# Patient Record
Sex: Male | Born: 1955
Health system: Southern US, Community
[De-identification: ages and names within clinical notes are randomized; demographics above are authoritative.]

## PROBLEM LIST (undated history)

## (undated) ENCOUNTER — Ambulatory Visit: Admission: EM | Discharge status: Absent without leave | Payer: 59

## (undated) DIAGNOSIS — C01 Malignant neoplasm of base of tongue: Secondary | ICD-10-CM

## (undated) DIAGNOSIS — M1990 Unspecified osteoarthritis, unspecified site: Secondary | ICD-10-CM

## (undated) DIAGNOSIS — Z9221 Personal history of antineoplastic chemotherapy: Secondary | ICD-10-CM

## (undated) DIAGNOSIS — Z923 Personal history of irradiation: Secondary | ICD-10-CM

## (undated) HISTORY — DX: Unspecified osteoarthritis, unspecified site: M19.90

## (undated) HISTORY — DX: Personal history of antineoplastic chemotherapy: Z92.21

## (undated) HISTORY — DX: Malignant neoplasm of base of tongue: C01

## (undated) HISTORY — DX: Personal history of irradiation: Z92.3

---

## 1998-06-13 ENCOUNTER — Other Ambulatory Visit: Admission: RE | Admit: 1998-06-13 | Discharge: 1998-06-13 | Payer: Self-pay | Admitting: Family Medicine

## 2003-11-24 DIAGNOSIS — Z9221 Personal history of antineoplastic chemotherapy: Secondary | ICD-10-CM

## 2003-11-24 HISTORY — DX: Personal history of antineoplastic chemotherapy: Z92.21

## 2004-01-07 ENCOUNTER — Encounter (INDEPENDENT_AMBULATORY_CARE_PROVIDER_SITE_OTHER): Payer: Self-pay | Admitting: Specialist

## 2004-01-07 ENCOUNTER — Ambulatory Visit (HOSPITAL_BASED_OUTPATIENT_CLINIC_OR_DEPARTMENT_OTHER): Admission: RE | Admit: 2004-01-07 | Discharge: 2004-01-07 | Payer: Self-pay | Admitting: Otolaryngology

## 2004-01-07 ENCOUNTER — Ambulatory Visit (HOSPITAL_COMMUNITY): Admission: RE | Admit: 2004-01-07 | Discharge: 2004-01-07 | Payer: Self-pay | Admitting: Otolaryngology

## 2004-01-07 DIAGNOSIS — C01 Malignant neoplasm of base of tongue: Secondary | ICD-10-CM

## 2004-01-07 HISTORY — PX: DIRECT LARYNGOSCOPY: SHX5326

## 2004-01-07 HISTORY — DX: Malignant neoplasm of base of tongue: C01

## 2004-01-09 ENCOUNTER — Ambulatory Visit: Admission: RE | Admit: 2004-01-09 | Discharge: 2004-04-08 | Payer: Self-pay | Admitting: Radiation Oncology

## 2004-01-11 ENCOUNTER — Encounter: Admission: RE | Admit: 2004-01-11 | Discharge: 2004-01-11 | Payer: Self-pay | Admitting: Dentistry

## 2004-01-15 HISTORY — PX: MULTIPLE EXTRACTIONS WITH ALVEOLOPLASTY: SHX5342

## 2004-01-17 ENCOUNTER — Ambulatory Visit (HOSPITAL_COMMUNITY): Admission: RE | Admit: 2004-01-17 | Discharge: 2004-01-17 | Payer: Self-pay | Admitting: Radiation Oncology

## 2004-01-21 ENCOUNTER — Ambulatory Visit (HOSPITAL_COMMUNITY): Admission: RE | Admit: 2004-01-21 | Discharge: 2004-01-21 | Payer: Self-pay | Admitting: Radiation Oncology

## 2004-01-23 ENCOUNTER — Ambulatory Visit (HOSPITAL_COMMUNITY): Admission: RE | Admit: 2004-01-23 | Discharge: 2004-01-23 | Payer: Self-pay | Admitting: Oncology

## 2004-02-04 ENCOUNTER — Inpatient Hospital Stay (HOSPITAL_COMMUNITY): Admission: AD | Admit: 2004-02-04 | Discharge: 2004-02-10 | Payer: Self-pay | Admitting: Oncology

## 2004-04-10 ENCOUNTER — Ambulatory Visit: Admission: RE | Admit: 2004-04-10 | Discharge: 2004-04-10 | Payer: Self-pay | Admitting: Radiation Oncology

## 2004-04-24 ENCOUNTER — Ambulatory Visit: Admission: RE | Admit: 2004-04-24 | Discharge: 2004-04-24 | Payer: Self-pay | Admitting: Radiation Oncology

## 2004-05-05 ENCOUNTER — Ambulatory Visit (HOSPITAL_COMMUNITY): Admission: RE | Admit: 2004-05-05 | Discharge: 2004-05-05 | Payer: Self-pay | Admitting: Oncology

## 2004-06-12 ENCOUNTER — Ambulatory Visit: Admission: RE | Admit: 2004-06-12 | Discharge: 2004-06-12 | Payer: Self-pay | Admitting: Radiation Oncology

## 2004-07-17 ENCOUNTER — Ambulatory Visit: Admission: RE | Admit: 2004-07-17 | Discharge: 2004-07-17 | Payer: Self-pay | Admitting: Radiation Oncology

## 2004-07-23 ENCOUNTER — Ambulatory Visit: Payer: Self-pay | Admitting: Dentistry

## 2004-07-31 ENCOUNTER — Ambulatory Visit: Payer: Self-pay | Admitting: Dentistry

## 2004-08-06 ENCOUNTER — Ambulatory Visit (HOSPITAL_COMMUNITY): Admission: RE | Admit: 2004-08-06 | Discharge: 2004-08-06 | Payer: Self-pay | Admitting: Radiation Oncology

## 2004-08-15 ENCOUNTER — Ambulatory Visit (HOSPITAL_COMMUNITY): Admission: RE | Admit: 2004-08-15 | Discharge: 2004-08-15 | Payer: Self-pay | Admitting: Oncology

## 2004-08-21 ENCOUNTER — Ambulatory Visit: Admission: RE | Admit: 2004-08-21 | Discharge: 2004-08-21 | Payer: Self-pay | Admitting: Radiation Oncology

## 2004-09-24 ENCOUNTER — Ambulatory Visit: Payer: Self-pay | Admitting: Dentistry

## 2004-10-23 ENCOUNTER — Ambulatory Visit: Admission: RE | Admit: 2004-10-23 | Discharge: 2004-10-23 | Payer: Self-pay | Admitting: Radiation Oncology

## 2004-10-27 ENCOUNTER — Ambulatory Visit: Admission: RE | Admit: 2004-10-27 | Discharge: 2004-10-27 | Payer: Self-pay | Admitting: Radiation Oncology

## 2004-10-30 ENCOUNTER — Ambulatory Visit: Payer: Self-pay | Admitting: Oncology

## 2004-12-01 ENCOUNTER — Ambulatory Visit: Payer: Self-pay | Admitting: Dentistry

## 2005-01-29 ENCOUNTER — Ambulatory Visit: Admission: RE | Admit: 2005-01-29 | Discharge: 2005-01-29 | Payer: Self-pay | Admitting: Radiation Oncology

## 2005-01-29 ENCOUNTER — Ambulatory Visit: Payer: Self-pay | Admitting: Oncology

## 2005-05-07 ENCOUNTER — Ambulatory Visit: Admission: RE | Admit: 2005-05-07 | Discharge: 2005-05-07 | Payer: Self-pay | Admitting: Radiation Oncology

## 2005-05-19 ENCOUNTER — Ambulatory Visit: Payer: Self-pay | Admitting: Oncology

## 2005-08-13 ENCOUNTER — Ambulatory Visit: Payer: Self-pay | Admitting: Oncology

## 2005-09-30 ENCOUNTER — Ambulatory Visit (HOSPITAL_COMMUNITY): Admission: RE | Admit: 2005-09-30 | Discharge: 2005-09-30 | Payer: Self-pay | Admitting: Oncology

## 2005-10-21 ENCOUNTER — Encounter (INDEPENDENT_AMBULATORY_CARE_PROVIDER_SITE_OTHER): Payer: Self-pay | Admitting: Specialist

## 2005-10-21 ENCOUNTER — Inpatient Hospital Stay (HOSPITAL_COMMUNITY): Admission: RE | Admit: 2005-10-21 | Discharge: 2005-10-23 | Payer: Self-pay | Admitting: Otolaryngology

## 2005-10-21 HISTORY — PX: OTHER SURGICAL HISTORY: SHX169

## 2005-12-10 ENCOUNTER — Ambulatory Visit: Payer: Self-pay | Admitting: Oncology

## 2006-01-28 ENCOUNTER — Ambulatory Visit: Payer: Self-pay | Admitting: Oncology

## 2006-05-31 ENCOUNTER — Ambulatory Visit: Payer: Self-pay | Admitting: Oncology

## 2006-06-01 LAB — CBC WITH DIFFERENTIAL/PLATELET
BASO%: 0.6 % (ref 0.0–2.0)
Basophils Absolute: 0 10*3/uL (ref 0.0–0.1)
EOS%: 2 % (ref 0.0–7.0)
Eosinophils Absolute: 0.1 10*3/uL (ref 0.0–0.5)
HCT: 40.8 % (ref 38.7–49.9)
HGB: 13.9 g/dL (ref 13.0–17.1)
LYMPH%: 25.7 % (ref 14.0–48.0)
MCH: 30.5 pg (ref 28.0–33.4)
MCHC: 34 g/dL (ref 32.0–35.9)
MCV: 89.8 fL (ref 81.6–98.0)
MONO#: 0.4 10*3/uL (ref 0.1–0.9)
MONO%: 14.1 % — ABNORMAL HIGH (ref 0.0–13.0)
NEUT#: 1.7 10*3/uL (ref 1.5–6.5)
NEUT%: 57.6 % (ref 40.0–75.0)
Platelets: 245 10*3/uL (ref 145–400)
RBC: 4.54 10*6/uL (ref 4.20–5.71)
RDW: 12.2 % (ref 11.2–14.6)
WBC: 2.9 10*3/uL — ABNORMAL LOW (ref 4.0–10.0)
lymph#: 0.8 10*3/uL — ABNORMAL LOW (ref 0.9–3.3)

## 2006-06-01 LAB — COMPREHENSIVE METABOLIC PANEL
ALT: 25 U/L (ref 0–40)
AST: 28 U/L (ref 0–37)
Albumin: 4.2 g/dL (ref 3.5–5.2)
Alkaline Phosphatase: 85 U/L (ref 39–117)
BUN: 21 mg/dL (ref 6–23)
CO2: 28 mEq/L (ref 19–32)
Calcium: 9.1 mg/dL (ref 8.4–10.5)
Chloride: 103 mEq/L (ref 96–112)
Creatinine, Ser: 1 mg/dL (ref 0.40–1.50)
Glucose, Bld: 92 mg/dL (ref 70–99)
Potassium: 4.4 mEq/L (ref 3.5–5.3)
Sodium: 142 mEq/L (ref 135–145)
Total Bilirubin: 0.5 mg/dL (ref 0.3–1.2)
Total Protein: 6.9 g/dL (ref 6.0–8.3)

## 2006-06-01 LAB — LACTATE DEHYDROGENASE: LDH: 130 U/L (ref 94–250)

## 2006-09-29 ENCOUNTER — Ambulatory Visit: Payer: Self-pay | Admitting: Oncology

## 2006-10-01 LAB — BUN: BUN: 18 mg/dL (ref 6–23)

## 2006-10-01 LAB — CREATININE, SERUM: Creatinine, Ser: 1.09 mg/dL (ref 0.40–1.50)

## 2007-03-21 ENCOUNTER — Ambulatory Visit: Payer: Self-pay | Admitting: Dentistry

## 2007-03-28 ENCOUNTER — Ambulatory Visit: Payer: Self-pay | Admitting: Oncology

## 2007-03-30 LAB — CBC WITH DIFFERENTIAL/PLATELET
BASO%: 0.6 % (ref 0.0–2.0)
Basophils Absolute: 0 10*3/uL (ref 0.0–0.1)
EOS%: 3.3 % (ref 0.0–7.0)
Eosinophils Absolute: 0.1 10*3/uL (ref 0.0–0.5)
HCT: 41.1 % (ref 38.7–49.9)
HGB: 14.4 g/dL (ref 13.0–17.1)
LYMPH%: 25.7 % (ref 14.0–48.0)
MCH: 30.9 pg (ref 28.0–33.4)
MCHC: 35 g/dL (ref 32.0–35.9)
MCV: 88.2 fL (ref 81.6–98.0)
MONO#: 0.3 10*3/uL (ref 0.1–0.9)
MONO%: 10.9 % (ref 0.0–13.0)
NEUT#: 1.9 10*3/uL (ref 1.5–6.5)
NEUT%: 59.5 % (ref 40.0–75.0)
Platelets: 256 10*3/uL (ref 145–400)
RBC: 4.66 10*6/uL (ref 4.20–5.71)
RDW: 12.2 % (ref 11.2–14.6)
WBC: 3.2 10*3/uL — ABNORMAL LOW (ref 4.0–10.0)
lymph#: 0.8 10*3/uL — ABNORMAL LOW (ref 0.9–3.3)

## 2007-03-30 LAB — COMPREHENSIVE METABOLIC PANEL
Albumin: 4.3 g/dL (ref 3.5–5.2)
BUN: 17 mg/dL (ref 6–23)
CO2: 27 mEq/L (ref 19–32)
Calcium: 9.3 mg/dL (ref 8.4–10.5)
Chloride: 103 mEq/L (ref 96–112)
Creatinine, Ser: 1.12 mg/dL (ref 0.40–1.50)
Potassium: 4.1 mEq/L (ref 3.5–5.3)

## 2007-03-30 LAB — MORPHOLOGY
PLT EST: ADEQUATE
RBC Comments: NORMAL

## 2007-03-30 LAB — LACTATE DEHYDROGENASE: LDH: 115 U/L (ref 94–250)

## 2007-09-25 IMAGING — CT NM PET TUM IMG SKULL BASE T - THIGH
8 series · 25 of 25 positions shown · IV contrast ([ID])
Comparison: 08/15/04.

CLINICAL DATA: Head and neck carcinoma.  Evaluate for disease recurrence.  
FDG PET-CT TUMOR IMAGING (SKULL BASE TO THIGHS):
TECHNIQUE: 17 mCi F-18 FDG were administered via left antecubital fossa.  Full ring PET imaging was performed from the skull base through the mid-thighs 85 minutes after injection.  CT data was obtained and used for attenuation correction and anatomic localization only.  (This was not acquired as a diagnostic CT examination.)

[Series 1: pet ac · axial · 3.3mm · 4.69mm/px · z∈[-294,+0]mm · 2 of 91 slices shown (1 of 2)]
[im 1/91]
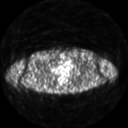
[im 91/91]
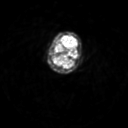

[Series 1: pet ac · axial · 3.3mm · 4.69mm/px · z∈[-870,+0]mm · 5 of 267 slices shown (2 of 2)]
[im 1/267]
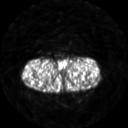
[im 67/267]
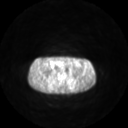
[im 134/267]
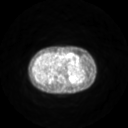
[im 200/267]
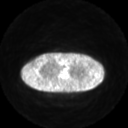
[im 267/267]
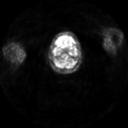

[Series 2: pet nac · axial · 3.3mm · 4.69mm/px · z∈[-294,+0]mm · 2 of 91 slices shown (1 of 2)]
[im 1/91]
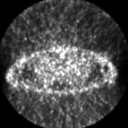
[im 91/91]
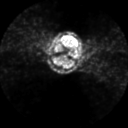

[Series 2: ct images · axial · 3.8mm · 0.98mm/px · z∈[-870,+0]mm · 6 of 267 slices shown (1 of 2)]
[im 1/267]
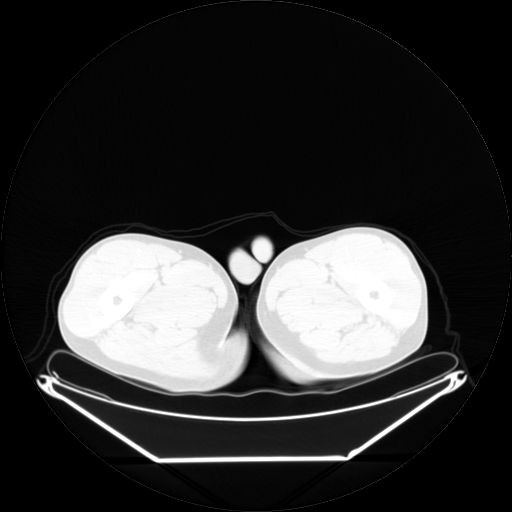
[im 54/267]
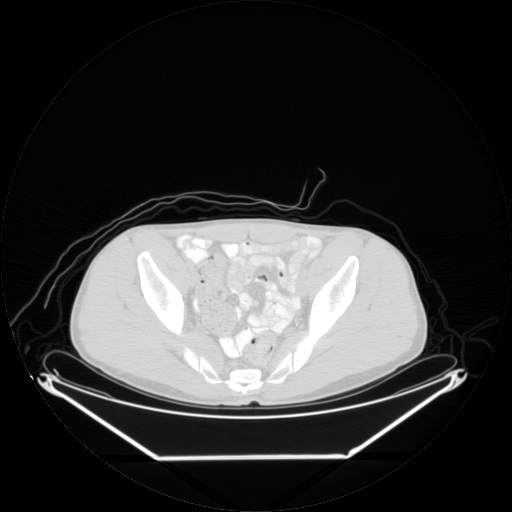
[im 107/267]
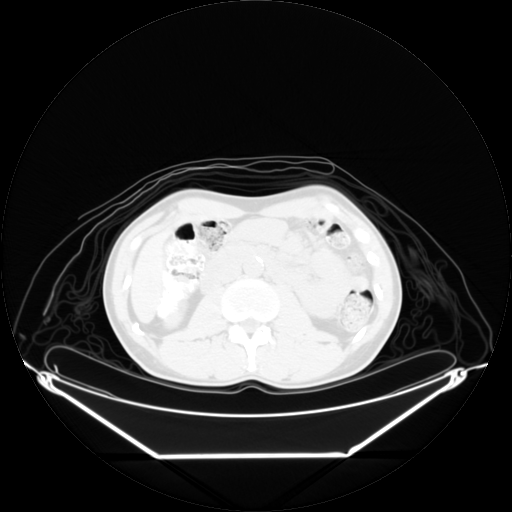
[im 160/267]
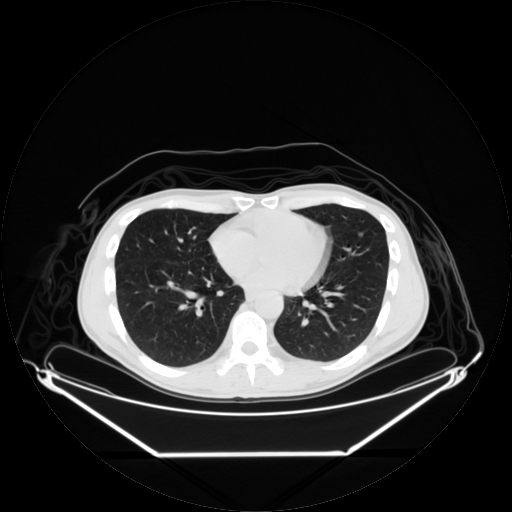
[im 213/267]
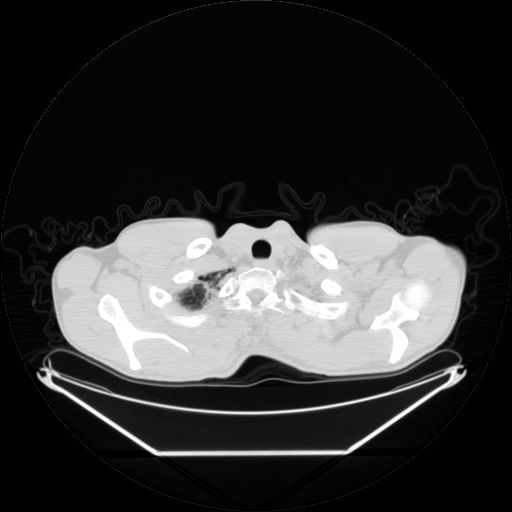
[im 267/267  brain]
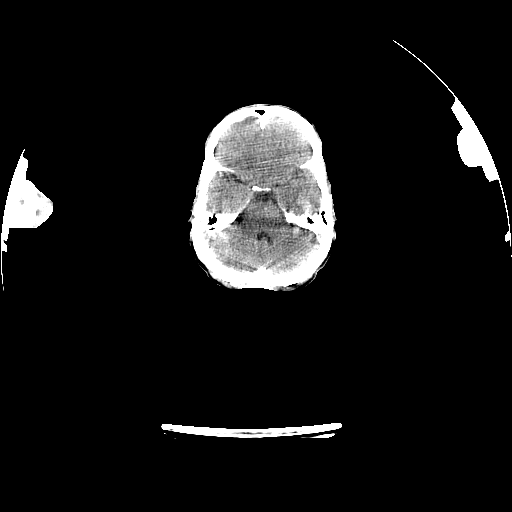

[Series 2: pet nac · axial · 3.3mm · 4.69mm/px · z∈[-870,+0]mm · 6 of 267 slices shown (2 of 2)]
[im 1/267]
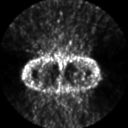
[im 54/267]
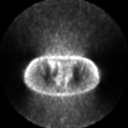
[im 107/267]
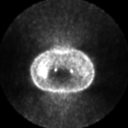
[im 160/267]
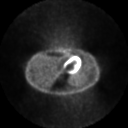
[im 213/267]
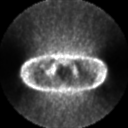
[im 267/267]
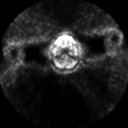

[Series 5: ct images · axial · 3.8mm · 0.98mm/px · z∈[-294,+0]mm · 2 of 91 slices shown (2 of 2)]
[im 1/91]
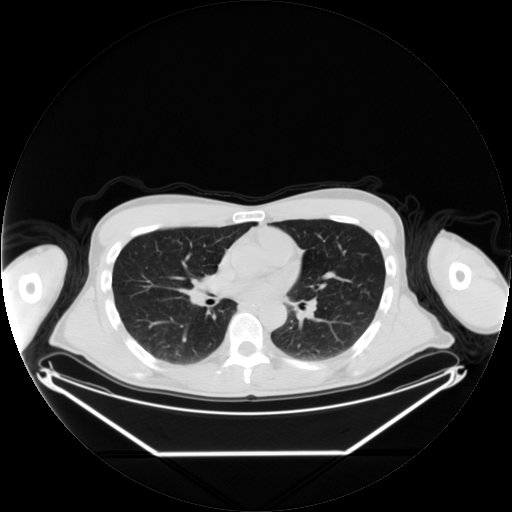
[im 91/91]
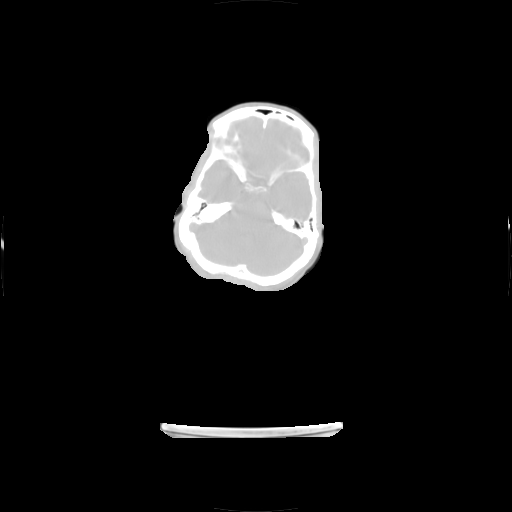

[Series 123: mip · coronal · 3.3mm · 4.69mm/px · 1 of 30 slices shown (1 of 2)]
[im 1/30]
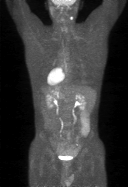

[Series 123: mip · coronal · 3.3mm · 4.69mm/px · 1 of 30 slices shown (2 of 2)]
[im 1/30]
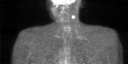

[25 of 25 positions shown; findings below may reference images not displayed]

FINDINGS: Within the right internal jugular lymph node chain there is an intense focus of increased FDG uptake corresponding to an enlarged lymph node.  The SUV max for this lymph node equals 17.0.  
No additional abnormal foci of increased radiotracer uptake is identified within the soft tissues of the neck. 
No hypermetabolic axillary lymphadenopathy.  No hypermetabolic hilar or mediastinal lymphadenopathy.  
No hypermetabolic pulmonary nodules or masses are identified.  
There is a right iliac lymph node which measures 10.4 x 6.4 mm and has moderate increased FDG uptake with an SUV max equal to 5.5.  
No additional hypermetabolic lymph nodes are identified.
IMPRESSION: 1.  Hypermetabolic left internal jugular lymph node concerning for disease recurrence. 
2.  Non-pathologically enlarged right iliac lymph node demonstrates moderate increased FDG uptake.  This would be an atypical presentation for head and neck cancer metastasis and I suspect this likely represents reactive lymphadenopathy.  Nevertheless attention on followup examination is recommended.

## 2007-10-05 ENCOUNTER — Ambulatory Visit: Payer: Self-pay | Admitting: Oncology

## 2007-10-11 LAB — COMPREHENSIVE METABOLIC PANEL
ALT: 28 U/L (ref 0–53)
AST: 21 U/L (ref 0–37)
Albumin: 4.2 g/dL (ref 3.5–5.2)
Alkaline Phosphatase: 81 U/L (ref 39–117)
BUN: 16 mg/dL (ref 6–23)
Calcium: 9.3 mg/dL (ref 8.4–10.5)
Chloride: 103 mEq/L (ref 96–112)
Potassium: 3.7 mEq/L (ref 3.5–5.3)
Sodium: 142 mEq/L (ref 135–145)
Total Protein: 7.1 g/dL (ref 6.0–8.3)

## 2007-10-11 LAB — CBC WITH DIFFERENTIAL/PLATELET
Basophils Absolute: 0.1 10*3/uL (ref 0.0–0.1)
EOS%: 3.7 % (ref 0.0–7.0)
Eosinophils Absolute: 0.1 10*3/uL (ref 0.0–0.5)
HGB: 15.3 g/dL (ref 13.0–17.1)
MCH: 30.5 pg (ref 28.0–33.4)
MCV: 87.9 fL (ref 81.6–98.0)
MONO%: 9.1 % (ref 0.0–13.0)
NEUT#: 1.6 10*3/uL (ref 1.5–6.5)
RBC: 5.03 10*6/uL (ref 4.20–5.71)
RDW: 12.5 % (ref 11.2–14.6)
lymph#: 0.9 10*3/uL (ref 0.9–3.3)

## 2007-10-14 IMAGING — CR DG CHEST 2V
2 series · 2 of 2 positions shown · non-contrast
Comparison: CT chest 05/05/2004.

CLINICAL DATA: Cancer involving the base of the tongue. Preoperative respiratory
evaluation.

CHEST - 2 VIEW  10/19/2005:

[view not recorded (1 of 2)]
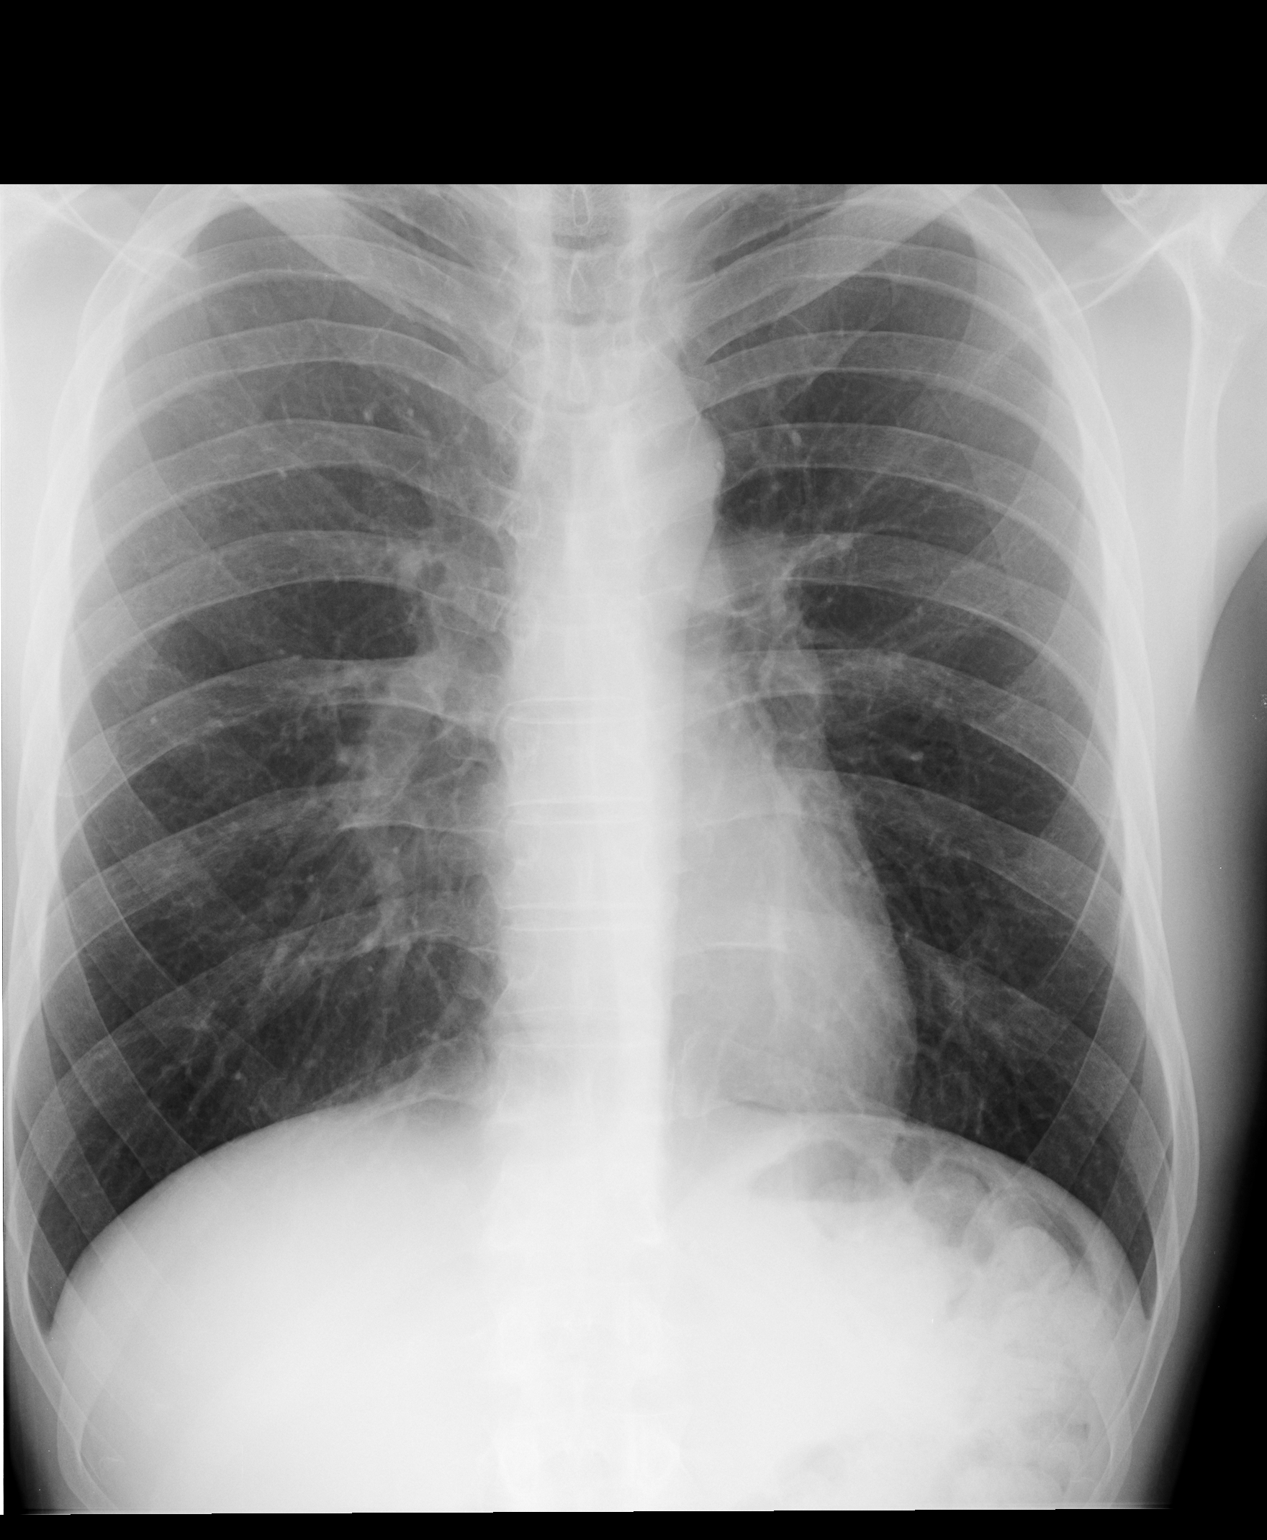

[view not recorded (2 of 2)]
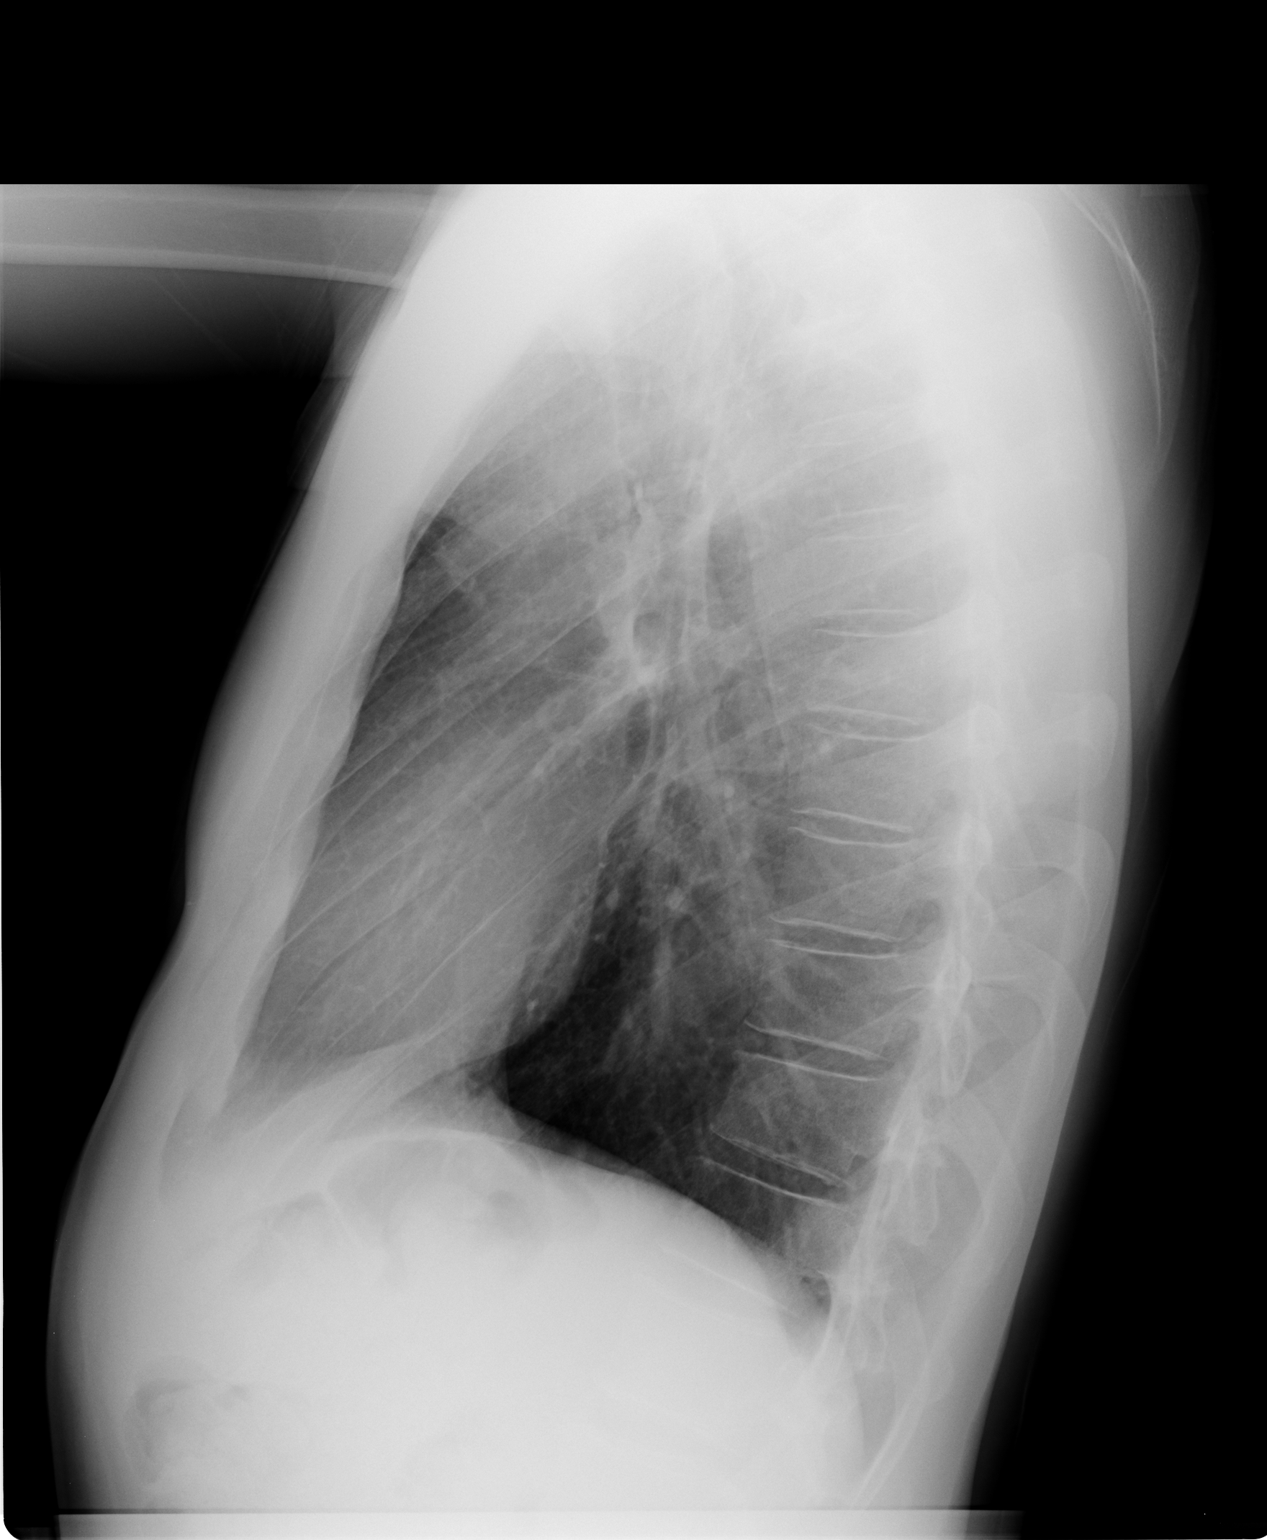

[2 of 2 positions shown; findings below may reference images not displayed]

FINDINGS: The cardiomediastinal silhouette is unremarkable. The lungs are
mildly hyperinflated but clear. There are no pleural effusions. The visualized
bony thorax appears intact.
IMPRESSION: Hyperinflation consistent with COPD or asthma. No acute cardiopulmonary disease.
No evidence of metastasis.

## 2007-11-18 ENCOUNTER — Ambulatory Visit: Payer: Self-pay | Admitting: Oncology

## 2008-04-05 ENCOUNTER — Ambulatory Visit: Payer: Self-pay | Admitting: Oncology

## 2008-09-20 ENCOUNTER — Ambulatory Visit: Payer: Self-pay | Admitting: Oncology

## 2008-09-24 LAB — COMPREHENSIVE METABOLIC PANEL
ALT: 20 U/L (ref 0–53)
AST: 20 U/L (ref 0–37)
Albumin: 4.2 g/dL (ref 3.5–5.2)
Alkaline Phosphatase: 95 U/L (ref 39–117)
BUN: 21 mg/dL (ref 6–23)
CO2: 27 mEq/L (ref 19–32)
Calcium: 9 mg/dL (ref 8.4–10.5)
Chloride: 104 mEq/L (ref 96–112)
Creatinine, Ser: 1.07 mg/dL (ref 0.40–1.50)
Glucose, Bld: 98 mg/dL (ref 70–99)
Potassium: 4.2 mEq/L (ref 3.5–5.3)
Sodium: 138 mEq/L (ref 135–145)
Total Bilirubin: 0.5 mg/dL (ref 0.3–1.2)
Total Protein: 6.9 g/dL (ref 6.0–8.3)

## 2008-09-24 LAB — CBC WITH DIFFERENTIAL/PLATELET
BASO%: 0.7 % (ref 0.0–2.0)
Basophils Absolute: 0 10*3/uL (ref 0.0–0.1)
EOS%: 5 % (ref 0.0–7.0)
Eosinophils Absolute: 0.2 10*3/uL (ref 0.0–0.5)
HCT: 44.4 % (ref 38.7–49.9)
HGB: 15.4 g/dL (ref 13.0–17.1)
LYMPH%: 28.5 % (ref 14.0–48.0)
MCH: 31 pg (ref 28.0–33.4)
MCHC: 34.7 g/dL (ref 32.0–35.9)
MCV: 89.4 fL (ref 81.6–98.0)
MONO#: 0.5 10*3/uL (ref 0.1–0.9)
MONO%: 14.9 % — ABNORMAL HIGH (ref 0.0–13.0)
NEUT#: 1.8 10*3/uL (ref 1.5–6.5)
NEUT%: 50.9 % (ref 40.0–75.0)
Platelets: 249 10*3/uL (ref 145–400)
RBC: 4.96 10*6/uL (ref 4.20–5.71)
RDW: 12.6 % (ref 11.2–14.6)
WBC: 3.6 10*3/uL — ABNORMAL LOW (ref 4.0–10.0)
lymph#: 1 10*3/uL (ref 0.9–3.3)

## 2008-09-24 LAB — MORPHOLOGY: PLT EST: ADEQUATE

## 2008-09-24 LAB — LACTATE DEHYDROGENASE: LDH: 104 U/L (ref 94–250)

## 2009-09-18 ENCOUNTER — Ambulatory Visit: Payer: Self-pay | Admitting: Oncology

## 2010-12-13 ENCOUNTER — Encounter: Payer: Self-pay | Admitting: Radiation Oncology

## 2011-04-10 NOTE — Discharge Summary (Signed)
[name], [name]             ACCOUNT NO.:  [id number]   MEDICAL RECORD NO.:  [id number]          PATIENT TYPE:  INP   LOCATION:  5707                         FACILITY:  [facility]   PHYSICIAN:  Kristine Garbe. Ezzard Standing, M.D.DATE OF BIRTH:  10/26/1956   DATE OF ADMISSION:  10/21/2005  DATE OF DISCHARGE:  10/23/2005                                 DISCHARGE SUMMARY   ADMITTING DIAGNOSIS:  Right neck metastatic squamous cell carcinoma.   OPERATIONS DURING THIS HOSPITALIZATION:  Direct laryngoscopy, right radical  neck dissection on October 21, 2005.   CONSULTS:  None.   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 He  underwent primary treatment with radiation therapy about a year ago. He had  a slowly enlarging right neck node consistent with persistent right  metastatic disease. He is admitted to the hospital via the operating room at  this time for a right radical neck dissection.   HOSPITAL COURSE:  The patient was admitted via the operating room on  October 21, 2005 at which time he underwent a direct laryngoscopy and right  radical neck dissection. This was uncomplicated postoperatively. The patient  received perioperative antibiotic of Ancef. He remained afebrile. Vital  signs were stable. He had no difficulty with p.o. diet. His neck drain was  subsequently removed on the second postoperative day and the patient was  discharged to home. Final pathology report revealed 1 of 10 right neck nodes  positive for squamous cell carcinoma.   DISPOSITION:  The patient is discharged to home on Tylenol and Vicodin  p.r.n. pain. A regular diet. Have him follow up in my office in 5 days for  recheck to have the sutures and staples removed from the right neck.           ______________________________  Kristine Garbe. Ezzard Standing, M.D.     CEN/MEDQ  D:  12/30/2005  T:  12/30/2005  Job:   [id number]

## 2011-04-10 NOTE — Op Note (Signed)
[name], Benitez             ACCOUNT NO.:  [id number]   MEDICAL RECORD NO.:  [id number]          PATIENT TYPE:  INP   LOCATION:  5707                         FACILITY:  [facility]   PHYSICIAN:  Kristine Garbe. Ezzard Standing, M.D.DATE OF BIRTH:  1956/02/19   DATE OF PROCEDURE:  10/21/2005  DATE OF DISCHARGE:                                 OPERATIVE REPORT   PREOPERATIVE DIAGNOSIS:  Metastatic squamous cell carcinoma to the right  neck.   POSTOPERATIVE DIAGNOSIS:  Metastatic squamous cell carcinoma to the right  neck.   OPERATION:  1.  Direct laparoscopy.  2.  Modified right radical neck dissection.   SURGEON:  Kristine Garbe. Ezzard Standing, M.D.   ASSISTANT:  Hermelinda Medicus, M.D.   ANESTHESIA:  General endotracheal.   COMPLICATIONS:  None.   ESTIMATED BLOOD LOSS:  50 mL.   BRIEF CLINICAL NOTE:  [name] is a 55 year old gentleman who is  status post chemoradiation therapy for primary treatment of a right base of  tongue cancer, T2, N1 lesion.  He had done well for 54 months, but more  recently he had noticed an enlarging right upper jugular lymph node.  PET  scan was performed and was consistent with carcinoma in the upper jugular  lymph node with no evidence of recurrence of a primary on PET scan.  On exam  in the office, upper airway exam was clear.  He has an approximate 2-cm  right upper jugular lymph node just inferior to the angle of the mandible.  He is taken to the operating room at this time for a right neck dissection.   DESCRIPTION OF PROCEDURE:  After adequate endotracheal anesthesia, the  patient received 1 g of Ancef IV preoperatively.  First, direct laparoscopy  was performed.  On direct laryngoscopy, the base of tongue and vallecula  were clear, epiglottis was normal, piriform sinuses were clear and both  vocal cords were normal in appearance.  Palpation of the base of the tongue  was soft.  This completed the direct laryngoscopy; no biopsies were taken.   Next, the patient was turned and the neck was prepped with Betadine solution  and draped out with sterile towels.  The proposed incisions sites were made  and injected with Xylocaine with epinephrine.  Incision was then carried as  a horizontal incision below the angle of the mandible, through the platysma  muscle and a small vertical limb was dropped posteriorly, again through the  platysma muscle.  Subplatysmal flaps were elevated superiorly, posterior and  inferiorly.  First the cranial nerve XI, the accessory nerve, was identified  in the posterior portion of the neck.  Next, dissection was carried out  superiorly.  The palpable node was located just posterior to the  submandibular gland.  Dissection was carried out toward the submandibular  gland.  Because of no palpable adenopathy in this area and the node being a  couple of centimeters posterior to the submandibular gland, it was elected  to preserve the submandibular gland and this was not resected.  The  digastric muscle was identified and dissection was then carried out from the  digastric muscle posteriorly.  The 11th nerve was identified in its superior  aspect just lateral to the jugular vein.  The 11th nerve was then traced  inferiorly through the sternocleidomastoid muscle until we identified it  where we had identified it previously in the posterior portion of the neck.  The 11th nerve was preserved throughout its dissection.  The posterior  portion of the neck accessory lymph nodes were sent as a separate specimen.  Dissection was then carried along the jugular vein inferiorly down to the  omohyoid muscle.  Dissection was minimally distal to the omohyoid muscle.  The attachments of the sternocleidomastoid muscle to the clavicle and  sternum were transected.  The internal jugular vein was dissected out and it  was ligated with 2-0 silk sutures and 3-0 silk suture ligature and divided.  The supraclavicular fat pad and tissue  were dissected up off the deep  cervical fascia.  The carotid artery and vagus nerve were identified and  preserved.  The dissection was then carried off superiorly up along the deep  cervical fascia, up until we reached the superior aspect of the jugular  vein.  The cranial nerve XII, hypoglossal nerve, was identified and  preserved.  Veins around this nerve were ligated with 2-0 silk ligatures and  divided.  The superior aspect of the vein was then identified and was again  ligated with 2-0 silk suture and 2-0 silk suture ligatures and divided; this  concluded the neck resection and the specimen was sent as the main neck  dissection to Pathology.  Hemostasis was obtained with electrocautery.  After obtaining adequate hemostasis, a Hemovac drain was brought out through  a separate stab incision inferiorly.  The neck defect was closed with 3-0  chromic sutures in the subcutaneous layer and staples to reapproximate the  skin edges.  This completed the procedure.  Whalen tolerated this well, was  awoken from anesthesia and transferred to the recovery room postop doing  well.   DISPOSITION:  Jhayden will be observed in the hospital for a couple days.  He will have his drain removed in 2 days and subsequently discharged home.           ______________________________  Kristine Garbe. Ezzard Standing, M.D.     CEN/MEDQ  D:  10/21/2005  T:  10/22/2005  Job:  [id number]   cc:   Hermelinda Medicus, M.D.  Fax: [phone]   Genene Churn. Cyndie Chime, M.D.  Fax: [phone]   Maryln Gottron, M.D.  Fax: [phone]

## 2011-04-10 NOTE — H&P (Signed)
[name], [name]                         ACCOUNT NO.:  [id number]   MEDICAL RECORD NO.:  [id number]                   PATIENT TYPE:  INP   LOCATION:  0280                                 FACILITY:  [facility]   PHYSICIAN:  Genene Churn. Cyndie Chime, M.D.          DATE OF BIRTH:  05-15-1956   DATE OF ADMISSION:  02/04/2004  DATE OF DISCHARGE:                                HISTORY & PHYSICAL   REASON FOR ADMISSION:  This is an elective admission to begin chemotherapy  and radiation therapy for this man with recently diagnosed cancer of the  tongue.   Mr. [name] is a pleasant 55 year old man who has been in excellent health  without any major medical or surgical illness.  He began to develop a  sticking sensation on the right side of his throat back in December.  This  persisted.  He then began to develop a sore throat and difficulty swallowing  pills.  He started to lose weight.  He sought consultation with Dr. Ovidio Kin.  Direct inspection was unrevealing.  However, examination under  anesthesia revealed an ulcerative mass along the right base of the tongue  and right vallecular region.  Biopsies were done on January 07, 2004 and  revealed an invasive moderately differentiated squamous cell carcinoma.  On  clinical exam, he was also felt to have a 2 cm palpable lymph node in the  right submandibular region.  PET scan showed activity in the area of obvious  tumor but in addition suggested that the tumor crossed the midline to the  left base of tongue, there was also increased activity in the right  parapharyngeal space with probable involvement of the right vallecula and  definite involvement of the right epiglottis.  Increased activity was noted  over the right submandibular lymph node but no other areas of malignant  adenopathy and no disease outside the head and neck were described.  A CT  scan of the neck and chest done on January 23, 2004 confirmed the known disease  at the right  base of tongue noted to be large but dimension is not stated in  the report.  The lesion extended inferiorly into the vallecula, across the  midline, along the epiglottis with extension into paralaryngeal fat planes.  No subglottic extension.  Necrotic right submandibular adenopathy noted 2.2  x 1.3 cm.  No obvious metastatic disease in the chest.  However, a  subcentimeter density noted in the right lung apex.  Additional scattered  lung parenchymal changes which did not show up on the PET scan.  No  mediastinal or hilar adenopathy.   The patient states that his baseline weight was 170 pounds.  Current weight  is down to 153.  He is able to eat a pureed diet at this time.  He has been  referred to dental medicine and has had multiple tooth extractions in  anticipation of high-dose radiation to the base  of tongue lesion.  He has  been referred to gastroenterology and has had a percutaneous gastrostomy  placed to support his nutrition through treatment.   PAST MEDICAL HISTORY:  No previous medical or surgical illness.  No chronic  medications.  Currently using Tylenol with hydrocodone elixir for pain.  No  allergies.   FAMILY HISTORY:  Parents deceased.  No malignant disease in his parents or 8  siblings.   SOCIAL HISTORY:  He is a Academic librarian at [employer].  He smoked one  pack of cigarettes daily since age 42.  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  Stopped 12 years ago.  Married.  Two stepchildren.   REVIEW OF SYSTEMS:  See HPI.  No significant respiratory distress.  No  ischemic chest pain.  No abdominal pain, nausea, vomiting, diarrhea, melena,  constipation, dysuria, frequency, hematuria.  No bone pain.   PHYSICAL EXAMINATION:  VITAL SIGNS:  Height 6 feet 2 inches, weight 153  pounds.  Body surface area 1.9 meters squared.  Blood pressure 130/80, pulse  52 regular, respirations 20, temperature 98.4 at time of office exam  January 21, 2004.  SKIN, HAIR, AND NAILS:   Normal.  HEENT:  Pupils equal, reactive to light.  Disks sharp, vessels normal.  Pharynx grossly normal without obvious mass.  LYMPHATICS:  I was unable to palpate any lymphadenopathy in the  submandibular, cervical, supraclavicular, or axillary regions.  LUNGS:  Clear, resonant to percussion.  HEART:  Regular cardiac rhythm.  No murmur.  ABDOMEN:  Soft, nontender, no mass, no organomegaly.  PEG tube noted.  EXTREMITIES:  No edema.  No calf tenderness.  NEUROLOGICAL:  Mental status intact.  Cranial nerves grossly normal.  Swallowing function not directly tested.  The motor strength is 5/5.  Reflex  is 2+ symmetric.  Coordination and gait normal.   LABORATORY DATA:  Hemoglobin 14.4, hematocrit 42, white 4100, 42%  neutrophils, 40% lymphocytes, 13 monocytes, platelets 306,000.  BUN 9,  creatinine 1, calcium 9.9, albumin 4.   IMPRESSION:  Stage III, T2 N1 M0 moderately differentiated squamous cell  carcinoma of the base of right tongue with extension to the vallecula,  epiglottis, and left base of tongue.  In addition, a single right  submandibular lymph gland is involved.   PLAN:  To begin combined modality radiation plus chemotherapy.  I have  elected to start him on a standard chemotherapy regimen since there are  currently no available clinical trials for a locally advanced surgically  unresectable disease.   Based on height 6 feet 2 inches, weight 153 pounds, body surface area 1.9  meters squared, he will receive cisplatin 70 mg per meters squared, 133 mg  total dose IV day #1 with aggressive pre and post hydration along with 5-  fluorouracil 1 gram per meter squared, continued total dose 1900 mg by  continuous IV infusion daily x4 days.  This will be repeated during the last  week of radiation.  He will receive concomitant conformal radiation by Dr.  Chipper Herb.                                               Genene Churn. Cyndie Chime, M.D.   Lottie Rater  D:  02/04/2004  T:   02/04/2004  Job:  [id number]   cc:   Maryln Gottron, M.D.  [address] -  [facility]  [place]  [place] [address]  Fax: [phone]   Kristine Garbe. Ezzard Standing, M.D.  [address]  [place] [address]  Fax: [phone]

## 2013-03-10 ENCOUNTER — Ambulatory Visit (HOSPITAL_COMMUNITY): Payer: Self-pay | Admitting: Dentistry

## 2013-03-10 ENCOUNTER — Encounter (HOSPITAL_COMMUNITY): Payer: Self-pay | Admitting: Dentistry

## 2013-03-10 VITALS — BP 134/88 | HR 54 | Temp 97.8°F

## 2013-03-10 DIAGNOSIS — Z463 Encounter for fitting and adjustment of dental prosthetic device: Secondary | ICD-10-CM

## 2013-03-10 DIAGNOSIS — K006 Disturbances in tooth eruption: Secondary | ICD-10-CM

## 2013-03-10 DIAGNOSIS — Z972 Presence of dental prosthetic device (complete) (partial): Secondary | ICD-10-CM | POA: Insufficient documentation

## 2013-03-10 DIAGNOSIS — K000 Anodontia: Secondary | ICD-10-CM

## 2013-03-10 DIAGNOSIS — M264 Malocclusion, unspecified: Secondary | ICD-10-CM

## 2013-03-10 DIAGNOSIS — K08109 Complete loss of teeth, unspecified cause, unspecified class: Secondary | ICD-10-CM

## 2013-03-10 DIAGNOSIS — K011 Impacted teeth: Secondary | ICD-10-CM

## 2013-03-10 NOTE — Progress Notes (Signed)
03/10/2013  Patient:            [name] Date of Birth:  1956-07-10 MRN:                [id number]  BP 134/88  Pulse 54  Temp(Src) 97.8 F (36.6 C) (Oral)   [name] is a 58 year old male that presents for periodic oral exam and evaluation for new upper and  lower complete dentures.  Medical Hx Update:  Past Medical History  Diagnosis Date  . 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 S/P radiation therapy 02/05/04 thru 03/26/04    Dr. Dayton Scrape  . S/P chemotherapy, time since greater than 12 weeks 2005    Dr. Cyndie Chime  . Arthritis     Right hand   Past Surgical History  Procedure Laterality Date  . Modified right radical neck dissection Right 10/21/2005    Dr. Narda Bonds  . Multiple extractions with alveoloplasty  01/15/2004    Dr. Annette Stable Brown-OMFS  . Direct laryngoscopy  01/07/2004    Dr. Ezzard Standing   ALLERGIES/ADVERSE DRUG REACTIONS: No Known Allergies MEDICATIONS: The patient denies use of any prescription or over-the-counter medications  C/C: Patient presents for fabrication of a new upper lower complete denture.  HPI:  [name] was diagnosed with squamous cell carcinoma of the right base of tongue in February of 2005. Patient underwent multiple extractions with alveoloplasty with Dr. Gwendlyn Deutscher (oral surgeon) on 01/15/2004. An impacted tooth #11 was left in the patient's mouth at that time.  Patient then underwent chemoradiation therapy with Dr. Cyndie Chime and Dr. Dayton Scrape that was completed in May of 2005. Patient subsequently underwent a modified radical neck dissection on the right side with Dr. Narda Bonds on 10/21/2005.  Patient had upper and lower complete dentures fabricated and inserted on 07/10/2004. Patient was followed for periodic oral exam's and denture adjustments through December of 2011. Please Note: Patient was referred to Dr. Gwendlyn Deutscher- oral surgeon, for the extraction of  tooth #11 and possible implant therapy in April 2008. Patient refused extraction of the tooth and insertion of implants at that time. The patient now presents for evaluation for fabrication of a new upper and lower complete denture.  DENTAL EXAM: General: Patient is a well-developed, tall, slightly built male in no acute distress. Vitals: As above. Extraoral Exam:  There is no palpable  lymphadenopathy.  The patient denies TMJ Symptoms. Intraoral  Exam:  The patient has xerostomia. The patient has severe atrophy of the edentulous alveolar ridges. There is no evidence of denture irritation or denture ulcerations. There is a 5 x 8 mm area of exposed impacted tooth #11 noted at this time. There is no evidence of abscess formation associated with this impacted tooth,however.  Dentition: Impacted tooth #11 with oral exposure on the palatal aspect area #11 that measures 5 x 8 mm.  Caries: No obvious dental caries noted to be affecting tooth #11 at this time. Endodontic: The patient currently denies acute pulpitits symptoms.  There is a coronal radiolucency around the impacted tooth #11 noted at this time. Prosthodontic: The patient has upper lower complete dentures. The dentures have less than ideal stability and retention.  Occlusion:  The occlusion of the dentures is less than ideal with an acquired class III malocclusion. Radiographic Interpretation: An orthopantogram was obtained along with a periapical radiograph #11. The patient is missing all teeth with exception of tooth #11  that is impacted. There is a coronal radiolucency associated with the impacted tooth #11. There is severe bone loss with atrophy of the edentulous alveolar ridges.  Assessments: 1. The patient is edentulous with the exception of impacted tooth #11.  2.  Tooth #11 is impacted with exposure of the coronal aspect towards the palatal aspect. This opening measures 5 x 8 mm. There is coronal radiolucency associated with this  impacted tooth. 3. There is severe atrophy of the edentulous alveolar ridges. 4. The patient has xerostomia. 5.  Ill-fitting maxillary and mandibular complete dentures 6. 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  Plan:  1. I discussed the risks, benefits, complications of various treatment options with the patient in relationship to his medical and dental conditions and previous radiation therapy. We discussed various treatment options to include referral to an oral surgeon for evaluation for removal of impacted tooth #11 as well as possible implant therapy, we discussed referral to a prosthodontist for possible implant therapy or fabrication of a new set of upper and lower complete dentures, and we discussed referral to alternate dental provider for fabrication of new upper and lower complete dentures.  The patient currently refuses referral to an oral surgeon for evaluation for extraction of the impacted tooth #11 and patient is aware of the coronal radiolucency and the potential risk for infection, abscess and other systemic involvement due to the impacted tooth. The patient refuses referral to a prosthodontist for evaluation for implants or possible upper and lower complete denture fabrication at this time. The patient does wish to proceed with fabrication of a new set of upper and lower complete dentures with dental medicine. Quote was provided. Initial impressions were obtained today. Patient is to return to clinic for continued fabrication of a new upper lower complete dentures as scheduled. Patient is to call if he decides that he wants referral to an oral surgeon at any time.  Charlynne Pander, DDS

## 2013-03-10 NOTE — Patient Instructions (Addendum)
Return to clinic for continued fabrication of new upper and lower complete dentures as scheduled. Dr. Kristin Bruins

## 2013-03-21 ENCOUNTER — Encounter (HOSPITAL_COMMUNITY): Payer: Self-pay | Admitting: Dentistry

## 2013-03-29 ENCOUNTER — Encounter (HOSPITAL_COMMUNITY): Payer: Self-pay | Admitting: Dentistry

## 2014-04-26 ENCOUNTER — Ambulatory Visit (HOSPITAL_COMMUNITY): Payer: Medicaid - Dental | Admitting: Dentistry

## 2014-04-26 ENCOUNTER — Encounter (INDEPENDENT_AMBULATORY_CARE_PROVIDER_SITE_OTHER): Payer: Self-pay

## 2014-04-26 ENCOUNTER — Encounter (HOSPITAL_COMMUNITY): Payer: Self-pay | Admitting: Dentistry

## 2014-04-26 VITALS — BP 143/84 | HR 65 | Temp 98.9°F

## 2014-04-26 DIAGNOSIS — M264 Malocclusion, unspecified: Secondary | ICD-10-CM

## 2014-04-26 DIAGNOSIS — Z463 Encounter for fitting and adjustment of dental prosthetic device: Secondary | ICD-10-CM

## 2014-04-26 DIAGNOSIS — K000 Anodontia: Secondary | ICD-10-CM

## 2014-04-26 DIAGNOSIS — K0889 Other specified disorders of teeth and supporting structures: Secondary | ICD-10-CM

## 2014-04-26 DIAGNOSIS — K0820 Unspecified atrophy of edentulous alveolar ridge: Secondary | ICD-10-CM

## 2014-04-26 DIAGNOSIS — K062 Gingival and edentulous alveolar ridge lesions associated with trauma: Secondary | ICD-10-CM

## 2014-04-26 DIAGNOSIS — K08109 Complete loss of teeth, unspecified cause, unspecified class: Secondary | ICD-10-CM

## 2014-04-26 DIAGNOSIS — K011 Impacted teeth: Secondary | ICD-10-CM

## 2014-04-26 DIAGNOSIS — Z972 Presence of dental prosthetic device (complete) (partial): Secondary | ICD-10-CM

## 2014-04-26 NOTE — Progress Notes (Signed)
04/26/2014  Patient:            [name] Date of Birth:  01-Feb-1956 MRN:                [id number]  BP 143/84  Pulse 65  Temp(Src) 98.9 F (37.2 C) (Oral)   [name] is a 58 year old male that presents for periodic oral exam and evaluation of denture irritation.   Medical Hx Update:  Past Medical History  Diagnosis Date  . 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 S/P radiation therapy 02/05/04 thru 03/26/04    Dr. Valere Dross  . S/P chemotherapy, time since greater than 12 weeks 2005    Dr. Beryle Beams  . Arthritis     Right hand   Past Surgical History  Procedure Laterality Date  . Modified right radical neck dissection Right 10/21/2005    Dr. Radene Journey  . Multiple extractions with alveoloplasty  01/15/2004    Dr. Rush Landmark Brown-[facility]  . Direct laryngoscopy  01/07/2004    Dr. Lucia Gaskins   ALLERGIES/ADVERSE DRUG REACTIONS: No Known Allergies MEDICATIONS: The patient denies use of any prescription or over-the-counter medications  C/C: Patient presents for evaluation of denture irritation and to discuss other treatment options.  HPI:  [name] was diagnosed with squamous cell carcinoma of the right base of tongue in February of 2005. Patient underwent multiple extractions with alveoloplasty with Dr. Alfred Levins (oral surgeon) on 01/15/2004. An impacted tooth #11 was left in the patient's mouth at that time.  Patient then underwent chemoradiation therapy with Dr. Beryle Beams and Dr. Valere Dross that was completed in May of 2005. Patient subsequently underwent a modified radical neck dissection on the right side with Dr. Radene Journey on 10/21/2005.  Patient had upper and lower complete dentures fabricated and inserted on 07/10/2004. Patient was followed for periodic oral exam's and denture adjustments through December of 2011. Please Note: Patient was referred to Dr. Alfred Levins- oral surgeon, for the extraction  of tooth #11 and possible implant therapy in April 2008. Patient refused extraction of the tooth and insertion of implants at that time. The patient now seen in April 2014 with plan for fabrication of new upper lower complete dentures. Patient then abandoned this treatment to significant hours required for his work. Patient now presents for evaluation of maxillary right denture irritation and to discuss fabrication of a new upper and lower complete denture and possible referral to an oral surgeon for extraction of impacted canine.  DENTAL EXAM: General: Patient is a well-developed, tall, slightly built male in no acute distress. Vitals: As above. Extraoral Exam:  There is no palpable  lymphadenopathy.  The patient denies TMJ Symptoms. Intraoral  Exam:  The patient has xerostomia. The patient has severe atrophy of the edentulous alveolar ridges. There is denture irritation involving the facial vestibule area #8 .There is a persistent 5 x 8 mm area of exposed impacted tooth #11 noted at this time. There is no evidence of abscess formation associated with this impacted tooth,however.  Dentition: Impacted tooth #11 with oral exposure on the palatal aspect area #11 that measures 5 x 8 mm.  Caries: No obvious dental caries noted to be affecting tooth #11 at this time. Endodontic: The patient currently denies acute pulpitits symptoms.  There is a coronal radiolucency around the impacted tooth #11. Prosthodontic: The patient has upper and lower complete dentures. The dentures have less than ideal stability  and retention. Pressure indicating paste was applied to the upper lower complete dentures. The dentures were adjusted as needed. Dentures were adjusted as needed. The patient accepted the results of the denture adjustment. Occlusion:  The occlusion of the dentures is less than ideal with an acquired class III malocclusion.   Assessments: 1. The patient is edentulous with the exception of impacted tooth #11.   2.  Tooth #11 is impacted with exposure of the coronal aspect towards the palatal aspect. This opening measures 5 x 8 mm. There is coronal radiolucency associated with this impacted tooth. 3. There is severe atrophy of the edentulous alveolar ridges. 4. The patient has xerostomia. 5.  Ill-fitting maxillary and mandibular complete dentures 6. 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  Plan:  1. I discussed the risks, benefits, complications of various treatment options with the patient in relationship to his medical and dental conditions and previous radiation therapy. We discussed various treatment options to include referral to an oral surgeon for evaluation for removal of impacted tooth #11 as well as possible implant therapy, we discussed referral to a prosthodontist for possible implant therapy or fabrication of a new set of upper and lower complete dentures, and we discussed continued fabrication of new upper and lower complete dentures with dental medicine. The patient is currently thinking about his options and will discuss this with his wife. Patient will then contact dental medicine with his treatment choice and will be referred to the oral surgeon or prosthodontist as needed.    Lenn Cal, DDS

## 2014-04-26 NOTE — Patient Instructions (Signed)
Patient is thinking about his options. We discussed referral to an oral surgeon for evaluation for extraction of impacted canine tooth and possible implant therapy. We discussed referral to a prosthodontist for fabrication of a new set of upper lower complete dentures with or without implants. We discussed fabrication of a new set of upper and lower complete dentures with dental medicine without implants. We discussed continued use of existing dentures as is with evaluation of dentures on a yearly basis and as needed for denture irritation. Dr. Enrique Sack

## 2014-07-02 ENCOUNTER — Encounter (HOSPITAL_COMMUNITY): Payer: Self-pay | Admitting: Dentistry

## 2014-07-02 ENCOUNTER — Encounter (INDEPENDENT_AMBULATORY_CARE_PROVIDER_SITE_OTHER): Payer: Self-pay

## 2014-07-02 ENCOUNTER — Ambulatory Visit (HOSPITAL_COMMUNITY): Payer: Self-pay | Admitting: Dentistry

## 2014-07-02 VITALS — BP 141/86 | HR 63 | Temp 98.0°F

## 2014-07-02 DIAGNOSIS — K0889 Other specified disorders of teeth and supporting structures: Secondary | ICD-10-CM

## 2014-07-02 DIAGNOSIS — K1370 Unspecified lesions of oral mucosa: Secondary | ICD-10-CM

## 2014-07-02 DIAGNOSIS — K117 Disturbances of salivary secretion: Secondary | ICD-10-CM

## 2014-07-02 DIAGNOSIS — Z972 Presence of dental prosthetic device (complete) (partial): Secondary | ICD-10-CM

## 2014-07-02 DIAGNOSIS — M264 Malocclusion, unspecified: Secondary | ICD-10-CM

## 2014-07-02 DIAGNOSIS — K0820 Unspecified atrophy of edentulous alveolar ridge: Secondary | ICD-10-CM

## 2014-07-02 DIAGNOSIS — K000 Anodontia: Principal | ICD-10-CM

## 2014-07-02 DIAGNOSIS — K011 Impacted teeth: Secondary | ICD-10-CM

## 2014-07-02 DIAGNOSIS — K08109 Complete loss of teeth, unspecified cause, unspecified class: Secondary | ICD-10-CM

## 2014-07-02 DIAGNOSIS — K062 Gingival and edentulous alveolar ridge lesions associated with trauma: Secondary | ICD-10-CM

## 2014-07-02 NOTE — Patient Instructions (Signed)
Plan:  1. I discussed the risks, benefits, complications of various treatment options with the patient in relationship to his medical and dental conditions and previous radiation therapy. We discussed various treatment options to include referral to an oral surgeon for evaluation for removal of impacted tooth #11 as well as possible implant therapy, we discussed referral to a prosthodontist for fabrication of new upper lower complete dentures with or without possible implant therapy, and we discussed use of his current dentures as is. The patient currently  agree to be referred to a prosthodontist for fabrication of the upper lower complete dentures with or without implant therapy. Ideally the patient also needs further oral surgeon for evaluation for removal of the impacted #11.  Patient will then contact dental medicine  if he needs further adjustment of his existing dentures in the interim.  2. keep dentures out if sore spots arise. 3. Use salt water rinses as needed to aid healing 4. Call if further denture adjustment as needed prior to seeing the prosthodontist.  Lenn Cal, DDS

## 2014-07-02 NOTE — Progress Notes (Signed)
07/02/2014  Patient:            [name] Date of Birth:  1956-05-26 MRN:                [id number]  BP 141/86  Pulse 63  Temp(Src) 98 F (36.7 C) (Oral)   [name] is a 58 year old male that presents for limited oral examination and evaluation of broken lower denture and denture irritation.   Medical Hx Update:  Past Medical History  Diagnosis Date  . 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 S/P radiation therapy 02/05/04 thru 03/26/04    Dr. Valere Dross  . S/P chemotherapy, time since greater than 12 weeks 2005    Dr. Beryle Beams  . Arthritis     Right hand   Past Surgical History  Procedure Laterality Date  . Modified right radical neck dissection Right 10/21/2005    Dr. Radene Journey  . Multiple extractions with alveoloplasty  01/15/2004    Dr. Rush Landmark Brown-[facility]  . Direct laryngoscopy  01/07/2004    Dr. Lucia Gaskins   ALLERGIES/ADVERSE DRUG REACTIONS: No Known Allergies  MEDICATIONS: The patient denies use of any prescription or over-the-counter medications  C/C: [name] is a 58 year old male that presents for limited oral examination and evaluation of broken lower denture and denture irritation.   HPI:  [name] was diagnosed with squamous cell carcinoma of the right base of tongue in February of 2005. Patient underwent multiple extractions with alveoloplasty with Dr. Alfred Levins (oral surgeon) on 01/15/2004. An impacted tooth #11 was left in the patient's mouth at that time.  Patient then underwent chemoradiation therapy with Dr. Beryle Beams and Dr. Valere Dross that was completed in May of 2005. Patient subsequently underwent a modified radical neck dissection on the right side with Dr. Radene Journey on 10/21/2005.  Patient had upper and lower complete dentures fabricated and inserted on 07/10/2004. Patient was followed for periodic oral exam's and denture adjustments through December of 2011.  Please Note: Patient was referred to Dr. Alfred Levins- oral surgeon, for the extraction of tooth #11 and possible implant therapy in April 2008. Patient refused extraction of the tooth and insertion of implants at that time. The patient was seen in April 2014 with plan for fabrication of new upper and lower complete dentures. Patient then abandoned this treatment due to significant hours required for his work. Patient then presented for evaluation of maxillary right denture irritation and to discuss fabrication of a new upper and lower complete denture and possible referral to an oral surgeon for extraction of impacted canine in June of 2015. Patient was thinking about his options but did not schedule any treatment. The patient then contacted dental medicine recently and indicated that he had broken the lower denture and repaired it himself, but now had significant lower denture irritation in the area of #28 on the buccal aspect. Patient presents today for limited oral examination and adjustment of lower denture as indicated.  DENTAL EXAM: General: Patient is a well-developed, tall, slightly built male in no acute distress. Vitals: As above. Extraoral Exam:  There is no palpable  lymphadenopathy.  The patient denies TMJ Symptoms. Intraoral  Exam:  The patient has xerostomia. The patient has severe atrophy of the edentulous alveolar ridges. There is denture irritation involving the facial vestibule area #28 .There is a persistent 5 x 8 mm area of exposed impacted tooth #11 noted  at this time. There is no evidence of abscess formation associated with this impacted tooth,however.  Dentition: Impacted tooth #11 with oral exposure on the palatal aspect area #11 that measures 5 x 8 mm.  Caries: No obvious dental caries noted to be affecting tooth #11 at this time. Endodontic: The patient currently denies acute pulpitits symptoms.  There is a coronal radiolucency around the impacted tooth #11. Prosthodontic: The  patient has upper and lower complete dentures. The dentures have less than ideal stability and retention. The lower denture has a fracture line in the midline area numbers 24/25 through the intagloi surface propgressing to the lingual aspect.  The pieces of the lower denture were not approximated correctly and the occlusion has been altered leaving the lower right anterior teeth numbers 25, 26, 27 a half a millimeter below the level of tooth numbers 22,23, and 24. Pressure indicating paste was applied to the upper and lower complete dentures. The dentures were adjusted as needed. Dentures were polished as needed. The patient accepted the results of the denture adjustment. Occlusion:  The occlusion of the dentures was adjusted to account for the change in the occlusion secondary to the malalignment of the lower denture repair. The patient accepted the results of the lower denture adjustment of the occlusion.  Assessments: 1. The patient is edentulous with the exception of impacted tooth #11.  2.  Tooth #11 is impacted with exposure of the coronal aspect towards the palatal aspect. This opening measures 5 x 8 mm. There is coronal radiolucency associated with this impacted tooth. 3. There is severe atrophy of the edentulous alveolar ridges. 4. The patient has xerostomia. 5.  Ill-fitting maxillary and mandibular complete dentures in need of new upper lower complete dentures with or without implants. Ideally needs a prosthodontist for the fabrication due to the complexity of the case. 6. Malocclusion of the dentures secondary to malalignment associated with the lower denture repair.  Plan:  1. I discussed the risks, benefits, complications of various treatment options with the patient in relationship to his medical and dental conditions and previous radiation therapy. We discussed various treatment options to include referral to an oral surgeon for evaluation for removal of impacted tooth #11 as well as  possible implant therapy, we discussed referral to a prosthodontist for fabrication of new upper lower complete dentures with or without possible implant therapy, and we discussed use of his current dentures as is. The patient currently  agree to be referred to a prosthodontist for fabrication of the upper lower complete dentures with or without implant therapy. Ideally the patient also needs further oral surgeon for evaluation for removal of the impacted #11.  Patient will then contact dental medicine  if he needs further adjustment of his existing dentures in the interim.    Lenn Cal, DDS

## 2014-07-04 ENCOUNTER — Telehealth (HOSPITAL_COMMUNITY): Payer: Self-pay | Admitting: Dentistry

## 2016-12-30 DIAGNOSIS — H5213 Myopia, bilateral: Secondary | ICD-10-CM | POA: Diagnosis not present

## 2017-03-09 ENCOUNTER — Other Ambulatory Visit (HOSPITAL_COMMUNITY): Payer: Self-pay | Admitting: Dentistry

## 2017-08-04 ENCOUNTER — Encounter (HOSPITAL_COMMUNITY): Payer: Self-pay | Admitting: *Deleted

## 2017-08-04 ENCOUNTER — Ambulatory Visit (HOSPITAL_COMMUNITY)
Admission: EM | Admit: 2017-08-04 | Discharge: 2017-08-04 | Disposition: A | Discharge status: Home / Self Care | Payer: 59 | Attending: Family Medicine | Admitting: Family Medicine

## 2017-08-04 DIAGNOSIS — S76319A Strain of muscle, fascia and tendon of the posterior muscle group at thigh level, unspecified thigh, initial encounter: Secondary | ICD-10-CM | POA: Diagnosis not present

## 2017-08-04 DIAGNOSIS — T148XXA Other injury of unspecified body region, initial encounter: Secondary | ICD-10-CM | POA: Diagnosis not present
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
## 2017-08-04 NOTE — Discharge Instructions (Signed)
Use ice to the areas of soreness for the first day or 2 and then go to heat. Change her gait stated did not have to bend her knee so much for the next few days. Limit squatting. And he may need to limit the amount of walking you are doing. Ibuprofen 600 mg every 6-8 hours as needed for pain may also take Tylenol. Just do not put any more stress on those muscles for the next 7-10 days until your feeling much better

## 2018-04-27 ENCOUNTER — Encounter (HOSPITAL_COMMUNITY): Payer: Self-pay | Admitting: Emergency Medicine

## 2018-04-27 ENCOUNTER — Ambulatory Visit (HOSPITAL_COMMUNITY)
Admission: EM | Admit: 2018-04-27 | Discharge: 2018-04-27 | Disposition: A | Discharge status: Home / Self Care | Payer: 59 | Attending: Internal Medicine | Admitting: Internal Medicine

## 2018-04-27 ENCOUNTER — Other Ambulatory Visit: Payer: Self-pay

## 2018-04-27 DIAGNOSIS — L03011 Cellulitis of right finger: Secondary | ICD-10-CM | POA: Diagnosis not present

## 2018-04-27 DIAGNOSIS — M79641 Pain in right hand: Secondary | ICD-10-CM

## 2018-04-27 MED ORDER — DOXYCYCLINE HYCLATE 100 MG PO CAPS: 100.0000 mg | ORAL_CAPSULE | Freq: Two times a day (BID) | ORAL | 0 refills | Status: AC

## 2018-04-27 MED ORDER — MUPIROCIN CALCIUM 2 % EX CREA: 1.0000 "application " | TOPICAL_CREAM | Freq: Two times a day (BID) | CUTANEOUS | 0 refills | Status: AC

## 2018-04-27 MED FILL — DOXYCYCLINE HYCLATE 100 MG: 100 | 10 days supply | Qty: 20 | Fill #0

## 2018-04-27 NOTE — ED Triage Notes (Signed)
Right middle finger with pain, swollen.  Patient thought splinter in finger and did poke it.

## 2018-04-27 NOTE — ED Provider Notes (Signed)
[place]   [id number] 04/27/18 Arrival Time: 1519  SUBJECTIVE: History from: patient. [name] is a 62 y.o. male complains of right middle finger pain and swelling that began 3 days ago.  He first noticed his symptoms after pulling a "spur" out of his finger while doing yardwork.  Localizes the pain to the right middle finger.  Describes the pain as intermittent and throbbing in character.  Has NOT tried OTC medications, but did try removing the splinter with a "sterilized" needle.  Symptoms are made worse at rest.  Denies similar symptoms in the past.  Complains of erythema and effusion.  Denies fever, chills, nausea, or vomiting.    ROS: As per HPI.  Past Medical History:  Diagnosis Date  . Arthritis    Right hand  . S/P chemotherapy, time since greater than 12 weeks 2005   Dr. Beryle Beams  . S/P radiation therapy 02/05/04 thru 03/26/04   Dr. Valere Dross  . 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 DIRECT LARYNGOSCOPY  01/07/2004   Dr. Lucia Gaskins  . Modified Right Radical Neck Dissection Right 10/21/2005   Dr. Radene Journey  . MULTIPLE EXTRACTIONS WITH ALVEOLOPLASTY  01/15/2004   Dr. Rush Landmark Brown-[facility]   No Known Allergies No current facility-administered medications on file prior to encounter.    No current outpatient medications on file prior to encounter.   Social History   Socioeconomic History  . Marital status: Married    Spouse name: Not on file  . Number of children: 2  . Years of education: Not on file  . Highest education level: Not on file  Occupational History  . Not on file  Social Needs  . Financial resource strain: Not on file  . Food insecurity:    Worry: Not on file    Inability: Not on file  . Transportation needs:    Medical: Not on file    Non-medical: Not on file  Tobacco Use  . Smoking status: Former Smoker    Packs/day: 1.00   Years: 30.00    Pack years: 30.00    Types: Cigarettes  . Smokeless tobacco: Never Used  Substance and Sexual Activity  . Alcohol use: No    Comment: Quit 1993  . Drug use: No  . Sexual activity: Not on file  Lifestyle  . Physical activity:    Days per week: Not on file    Minutes per session: Not on file  . Stress: Not on file  Relationships  . Social connections:    Talks on phone: Not on file    Gets together: Not on file    Attends religious service: Not on file    Active member of club or organization: Not on file    Attends meetings of clubs or organizations: Not on file    Relationship status: Not on file  . Intimate partner violence:    Fear of current or ex partner: Not on file    Emotionally abused: Not on file    Physically abused: Not on file    Forced sexual activity: Not on file  Other Topics Concern  . Not on file  Social History Narrative  . Not on file   Family History  Problem Relation Age of Onset  . Aneurysm Mother   . Stroke Father     OBJECTIVE:  Vitals:   04/27/18 1600  BP: Marland Kitchen)  170/87  Pulse: 70  Resp: 18  Temp: 98.4 F (36.9 C)  TempSrc: Oral  SpO2: 100%    General appearance: AOx3; in no acute distress.  Head: NCAT Lungs: CTA bilaterally Heart: RRR.  Clear S1 and S2 without murmur, gallops, or rubs.  Radial pulses 2+ bilaterally. Musculoskeletal:Right hand Inspection: small nonbloody punctate lesion with surrounding erythema and effusion localized to the the lateral and anterior aspect of the third distal finger.  No active drainage seen (see picture below) Palpation: Tender to palpation about the lateral aspect of the distal third finger; nontender at the nailbed or anterior aspect of finger ROM: FROM passive, flexes third DIP without difficulty; touches finger to thumb without difficulty Strength:  5/5 grip strength Skin: warm and dry Neurologic: Ambulates without difficulty; Sensation intact about the upper  extremities Psychological: alert and cooperative; normal mood and affect          ASSESSMENT & PLAN:  1. Cellulitis of finger of right hand   2. Hand pain, right     Meds ordered this encounter  Medications  . doxycycline (VIBRAMYCIN) 100 MG capsule    Sig: Take 1 capsule (100 mg total) by mouth 2 (two) times daily.    Dispense:  20 capsule    Refill:  0    Order Specific Question:   Supervising Provider    Answer:   Wynona Luna [phone]  . mupirocin cream (BACTROBAN) 2 %    Sig: Apply 1 application topically 2 (two) times daily.    Dispense:  15 g    Refill:  0    Order Specific Question:   Supervising Provider    Answer:   Wynona Luna [[id number]]    Rest and drink fluids Doxycycline prescribed.  Avoid taking this medication with dairy products or calcium supplements.  This may affect the absorption of this medication.  Take antibiotic as directed and to completion Bactroban ointment prescribed.  Use twice daily as instructed Return or go to the ER if your symptoms do not improve or you experience any new or worsening symptoms  Reviewed expectations re: course of current medical issues. Questions answered. Outlined signs and symptoms indicating need for more acute intervention. Patient verbalized understanding. After Visit Summary given.    Lestine Box, PA-C 04/27/18 1729

## 2018-04-27 NOTE — Discharge Instructions (Addendum)
Rest and drink fluids Doxycycline prescribed.  Avoid taking this medication with dairy products or calcium supplements.  This may affect the absorption of this medication.  Take antibiotic as directed and to completion Bactroban ointment prescribed.  Use twice daily as instructed Return or go to the ER if your symptoms do not improve or you experience any new or worsening symptoms

## 2018-07-27 DIAGNOSIS — M5030 Other cervical disc degeneration, unspecified cervical region: Secondary | ICD-10-CM | POA: Diagnosis not present

## 2018-07-27 DIAGNOSIS — M25512 Pain in left shoulder: Secondary | ICD-10-CM | POA: Diagnosis not present

## 2018-07-27 MED FILL — GABAPENTIN 300 MG CAPSULE: 300 | 30 days supply | Qty: 60 | Fill #0

## 2018-07-27 MED FILL — predniSONE 10 MG TABS: 10 | 10 days supply | Qty: 32 | Fill #0

## 2018-08-03 DIAGNOSIS — M5030 Other cervical disc degeneration, unspecified cervical region: Secondary | ICD-10-CM | POA: Diagnosis not present

## 2018-08-03 DIAGNOSIS — M25512 Pain in left shoulder: Secondary | ICD-10-CM | POA: Diagnosis not present

## 2018-08-03 MED FILL — diazePAM 5 MG TABS: 5 | 1 days supply | Qty: 4 | Fill #0

## 2018-08-03 MED FILL — BACLOFEN 10 MG TABLET: 10 | 20 days supply | Qty: 40 | Fill #0

## 2018-08-09 DIAGNOSIS — M542 Cervicalgia: Secondary | ICD-10-CM | POA: Diagnosis not present

## 2018-08-16 DIAGNOSIS — M542 Cervicalgia: Secondary | ICD-10-CM | POA: Diagnosis not present

## 2018-08-16 DIAGNOSIS — M5412 Radiculopathy, cervical region: Secondary | ICD-10-CM | POA: Diagnosis not present

## 2018-08-16 DIAGNOSIS — M5030 Other cervical disc degeneration, unspecified cervical region: Secondary | ICD-10-CM | POA: Diagnosis not present

## 2018-08-17 MED FILL — GABAPENTIN 600 MG TABLET: 600 | 30 days supply | Qty: 60 | Fill #0

## 2018-08-25 MED FILL — BACLOFEN 10 MG TABLET: 10 | 20 days supply | Qty: 40 | Fill #1

## 2018-09-01 DIAGNOSIS — M5412 Radiculopathy, cervical region: Secondary | ICD-10-CM | POA: Diagnosis not present

## 2018-09-01 DIAGNOSIS — M5030 Other cervical disc degeneration, unspecified cervical region: Secondary | ICD-10-CM | POA: Diagnosis not present

## 2018-09-01 DIAGNOSIS — M542 Cervicalgia: Secondary | ICD-10-CM | POA: Diagnosis not present

## 2018-09-21 DIAGNOSIS — H5213 Myopia, bilateral: Secondary | ICD-10-CM | POA: Diagnosis not present

## 2018-09-22 DIAGNOSIS — M542 Cervicalgia: Secondary | ICD-10-CM | POA: Diagnosis not present

## 2018-09-22 DIAGNOSIS — M5030 Other cervical disc degeneration, unspecified cervical region: Secondary | ICD-10-CM | POA: Diagnosis not present

## 2018-09-22 DIAGNOSIS — M5412 Radiculopathy, cervical region: Secondary | ICD-10-CM | POA: Diagnosis not present

## 2022-08-20 ENCOUNTER — Ambulatory Visit: Payer: 59 | Admitting: Nurse Practitioner

## 2022-09-29 DIAGNOSIS — H5213 Myopia, bilateral: Secondary | ICD-10-CM | POA: Diagnosis not present

## 2022-10-12 NOTE — Progress Notes (Deleted)
  Subjective:    [name] - 66 y.o. male MRN [id number]  Date of birth: [birth year]  HPI  [name] is a 66 year-old male to establish care.   Current issues and/or concerns:  ROS per HPI     Health Maintenance:  Health Maintenance Due  Topic Date Due   HIV Screening  Never done   Hepatitis C Screening  Never done   COLONOSCOPY (Pts 45-60yr Insurance coverage will need to be confirmed)  Never done   Lung Cancer Screening  11/13/2007   Zoster Vaccines- Shingrix (1 of 2) Never done   INFLUENZA VACCINE  Never done     Past Medical History: Patient Active Problem List   Diagnosis Date Noted   Ill-fitting dentures 03/10/2013      Social History   reports that he has quit smoking. His smoking use included cigarettes. He has a 30.00 pack-year smoking history. He has never used smokeless tobacco. He reports that he does not drink alcohol and does not use drugs.   Family History  family history includes Aneurysm in his mother; Stroke in his father.   Medications: reviewed and updated   Objective:   Physical Exam There were no vitals taken for this visit. Physical Exam      Assessment & Plan:         Patient was given clear instructions to go to Emergency Department or return to medical center if symptoms don't improve, worsen, or new problems develop.The patient verbalized understanding.  I discussed the assessment and treatment plan with the patient. The patient was provided an opportunity to ask questions and all were answered. The patient agreed with the plan and demonstrated an understanding of the instructions.   The patient was advised to call back or seek an in-person evaluation if the symptoms worsen or if the condition fails to improve as anticipated.    ADurene Fruits NP 10/12/2022, 8:46 AM [facility]

## 2022-10-19 ENCOUNTER — Ambulatory Visit: Payer: 59 | Admitting: Family

## 2022-10-19 DIAGNOSIS — Z7689 Persons encountering health services in other specified circumstances: Secondary | ICD-10-CM
